# Patient Record
Sex: Female | Born: 1960 | Race: Black or African American | Hispanic: No | Smoking: Never smoker
Health system: Southern US, Community
[De-identification: ages and names within clinical notes are randomized; demographics above are authoritative.]

## PROBLEM LIST (undated history)

## (undated) DIAGNOSIS — E785 Hyperlipidemia, unspecified: Secondary | ICD-10-CM

## (undated) DIAGNOSIS — M8580 Other specified disorders of bone density and structure, unspecified site: Secondary | ICD-10-CM

## (undated) HISTORY — DX: Hyperlipidemia, unspecified: E78.5

## (undated) HISTORY — PX: COLONOSCOPY: SHX174

## (undated) HISTORY — DX: Other specified disorders of bone density and structure, unspecified site: M85.80

## (undated) HISTORY — PX: DENTAL EXAMINATION UNDER ANESTHESIA W/ CLEANING AND XRAYS: SHX1448

---

## 2000-03-25 ENCOUNTER — Ambulatory Visit (HOSPITAL_COMMUNITY): Admission: RE | Admit: 2000-03-25 | Discharge: 2000-03-25 | Payer: Self-pay | Admitting: Obstetrics and Gynecology

## 2000-05-02 ENCOUNTER — Other Ambulatory Visit: Admission: RE | Admit: 2000-05-02 | Discharge: 2000-05-02 | Payer: Self-pay | Admitting: Obstetrics and Gynecology

## 2000-05-26 ENCOUNTER — Inpatient Hospital Stay (HOSPITAL_COMMUNITY): Admission: RE | Admit: 2000-05-26 | Discharge: 2000-05-27 | Payer: Self-pay | Admitting: Obstetrics and Gynecology

## 2016-04-13 ENCOUNTER — Other Ambulatory Visit (HOSPITAL_BASED_OUTPATIENT_CLINIC_OR_DEPARTMENT_OTHER): Payer: Self-pay | Admitting: Internal Medicine

## 2016-04-13 ENCOUNTER — Other Ambulatory Visit: Payer: Self-pay | Admitting: Internal Medicine

## 2016-04-13 DIAGNOSIS — Z1231 Encounter for screening mammogram for malignant neoplasm of breast: Secondary | ICD-10-CM

## 2016-04-15 ENCOUNTER — Encounter (HOSPITAL_BASED_OUTPATIENT_CLINIC_OR_DEPARTMENT_OTHER): Payer: Self-pay | Admitting: Radiology

## 2016-04-15 ENCOUNTER — Ambulatory Visit (HOSPITAL_BASED_OUTPATIENT_CLINIC_OR_DEPARTMENT_OTHER)
Admission: RE | Admit: 2016-04-15 | Discharge: 2016-04-15 | Disposition: A | Discharge status: Home / Self Care | Payer: Managed Care, Other (non HMO) | Source: Ambulatory Visit | Attending: Internal Medicine | Admitting: Internal Medicine

## 2016-04-15 DIAGNOSIS — Z1231 Encounter for screening mammogram for malignant neoplasm of breast: Secondary | ICD-10-CM

## 2016-05-13 ENCOUNTER — Ambulatory Visit: Payer: Self-pay

## 2017-03-23 ENCOUNTER — Other Ambulatory Visit (HOSPITAL_BASED_OUTPATIENT_CLINIC_OR_DEPARTMENT_OTHER): Payer: Self-pay | Admitting: Internal Medicine

## 2017-03-23 DIAGNOSIS — Z1231 Encounter for screening mammogram for malignant neoplasm of breast: Secondary | ICD-10-CM

## 2017-04-26 ENCOUNTER — Ambulatory Visit (HOSPITAL_BASED_OUTPATIENT_CLINIC_OR_DEPARTMENT_OTHER): Payer: Managed Care, Other (non HMO)

## 2017-04-28 ENCOUNTER — Ambulatory Visit (HOSPITAL_BASED_OUTPATIENT_CLINIC_OR_DEPARTMENT_OTHER)
Admission: RE | Admit: 2017-04-28 | Discharge: 2017-04-28 | Disposition: A | Discharge status: Home / Self Care | Payer: BLUE CROSS/BLUE SHIELD | Source: Ambulatory Visit | Attending: Internal Medicine | Admitting: Internal Medicine

## 2017-04-28 DIAGNOSIS — Z1231 Encounter for screening mammogram for malignant neoplasm of breast: Secondary | ICD-10-CM | POA: Insufficient documentation

## 2017-05-19 ENCOUNTER — Other Ambulatory Visit (HOSPITAL_COMMUNITY): Payer: Self-pay | Admitting: Internal Medicine

## 2017-05-19 ENCOUNTER — Ambulatory Visit (HOSPITAL_BASED_OUTPATIENT_CLINIC_OR_DEPARTMENT_OTHER): Payer: BLUE CROSS/BLUE SHIELD

## 2017-05-19 ENCOUNTER — Other Ambulatory Visit: Payer: Self-pay | Admitting: Internal Medicine

## 2017-05-19 DIAGNOSIS — R319 Hematuria, unspecified: Secondary | ICD-10-CM

## 2017-05-19 DIAGNOSIS — M545 Low back pain: Secondary | ICD-10-CM

## 2018-05-15 ENCOUNTER — Other Ambulatory Visit (HOSPITAL_BASED_OUTPATIENT_CLINIC_OR_DEPARTMENT_OTHER): Payer: Self-pay | Admitting: Internal Medicine

## 2018-05-15 DIAGNOSIS — Z1231 Encounter for screening mammogram for malignant neoplasm of breast: Secondary | ICD-10-CM

## 2018-05-17 ENCOUNTER — Encounter (INDEPENDENT_AMBULATORY_CARE_PROVIDER_SITE_OTHER): Payer: Self-pay

## 2018-05-17 ENCOUNTER — Ambulatory Visit (HOSPITAL_BASED_OUTPATIENT_CLINIC_OR_DEPARTMENT_OTHER)
Admission: RE | Admit: 2018-05-17 | Discharge: 2018-05-17 | Disposition: A | Discharge status: Home / Self Care | Payer: Managed Care, Other (non HMO) | Source: Ambulatory Visit | Attending: Internal Medicine | Admitting: Internal Medicine

## 2018-05-17 DIAGNOSIS — Z1231 Encounter for screening mammogram for malignant neoplasm of breast: Secondary | ICD-10-CM | POA: Insufficient documentation

## 2018-05-19 ENCOUNTER — Other Ambulatory Visit: Payer: Self-pay | Admitting: Internal Medicine

## 2018-05-19 DIAGNOSIS — R928 Other abnormal and inconclusive findings on diagnostic imaging of breast: Secondary | ICD-10-CM

## 2018-05-25 ENCOUNTER — Ambulatory Visit: Payer: Managed Care, Other (non HMO)

## 2018-05-25 ENCOUNTER — Ambulatory Visit
Admission: RE | Admit: 2018-05-25 | Discharge: 2018-05-25 | Disposition: A | Discharge status: Home / Self Care | Payer: Managed Care, Other (non HMO) | Source: Ambulatory Visit | Attending: Internal Medicine | Admitting: Internal Medicine

## 2018-05-25 DIAGNOSIS — R928 Other abnormal and inconclusive findings on diagnostic imaging of breast: Secondary | ICD-10-CM

## 2018-05-26 ENCOUNTER — Other Ambulatory Visit: Payer: Managed Care, Other (non HMO)

## 2019-04-25 ENCOUNTER — Other Ambulatory Visit: Payer: Self-pay | Admitting: Internal Medicine

## 2019-04-25 DIAGNOSIS — Z1231 Encounter for screening mammogram for malignant neoplasm of breast: Secondary | ICD-10-CM

## 2019-06-19 ENCOUNTER — Ambulatory Visit: Payer: Managed Care, Other (non HMO)

## 2019-07-24 ENCOUNTER — Other Ambulatory Visit: Payer: Self-pay

## 2019-07-24 ENCOUNTER — Ambulatory Visit
Admission: RE | Admit: 2019-07-24 | Discharge: 2019-07-24 | Disposition: A | Discharge status: Home / Self Care | Payer: Managed Care, Other (non HMO) | Source: Ambulatory Visit | Attending: Internal Medicine | Admitting: Internal Medicine

## 2019-07-24 DIAGNOSIS — Z1231 Encounter for screening mammogram for malignant neoplasm of breast: Secondary | ICD-10-CM

## 2019-07-29 ENCOUNTER — Other Ambulatory Visit: Payer: Self-pay

## 2019-07-29 ENCOUNTER — Emergency Department (HOSPITAL_BASED_OUTPATIENT_CLINIC_OR_DEPARTMENT_OTHER)
Admission: EM | Admit: 2019-07-29 | Discharge: 2019-07-29 | Disposition: A | Discharge status: Home / Self Care | Payer: Managed Care, Other (non HMO) | Attending: Emergency Medicine | Admitting: Emergency Medicine

## 2019-07-29 ENCOUNTER — Encounter (HOSPITAL_BASED_OUTPATIENT_CLINIC_OR_DEPARTMENT_OTHER): Payer: Self-pay

## 2019-07-29 DIAGNOSIS — R1030 Lower abdominal pain, unspecified: Secondary | ICD-10-CM

## 2019-07-29 LAB — CBC
HCT: 45.8 % (ref 36.0–46.0)
Hemoglobin: 15 g/dL (ref 12.0–15.0)
MCH: 28.7 pg (ref 26.0–34.0)
MCHC: 32.8 g/dL (ref 30.0–36.0)
MCV: 87.6 fL (ref 80.0–100.0)
Platelets: 334 10*3/uL (ref 150–400)
RBC: 5.23 MIL/uL — ABNORMAL HIGH (ref 3.87–5.11)
RDW: 12.5 % (ref 11.5–15.5)
WBC: 7.7 10*3/uL (ref 4.0–10.5)
nRBC: 0 % (ref 0.0–0.2)

## 2019-07-29 LAB — COMPREHENSIVE METABOLIC PANEL
ALT: 22 U/L (ref 0–44)
AST: 26 U/L (ref 15–41)
Albumin: 4.4 g/dL (ref 3.5–5.0)
Alkaline Phosphatase: 54 U/L (ref 38–126)
Anion gap: 8 (ref 5–15)
BUN: 19 mg/dL (ref 6–20)
CO2: 27 mmol/L (ref 22–32)
Calcium: 9.6 mg/dL (ref 8.9–10.3)
Chloride: 102 mmol/L (ref 98–111)
Creatinine, Ser: 0.75 mg/dL (ref 0.44–1.00)
GFR calc Af Amer: >60 mL/min (ref >60)
GFR calc non Af Amer: >60 mL/min (ref >60)
Glucose, Bld: 95 mg/dL (ref 70–99)
Potassium: 3.9 mmol/L (ref 3.5–5.1)
Sodium: 137 mmol/L (ref 135–145)
Total Bilirubin: 0.7 mg/dL (ref 0.3–1.2)
Total Protein: 7.7 g/dL (ref 6.5–8.1)

## 2019-07-29 LAB — URINALYSIS, ROUTINE W REFLEX MICROSCOPIC
Bilirubin Urine: NEGATIVE
Glucose, UA: NEGATIVE mg/dL
Ketones, ur: NEGATIVE mg/dL
Leukocytes,Ua: NEGATIVE
Nitrite: NEGATIVE
Protein, ur: NEGATIVE mg/dL
Specific Gravity, Urine: >1.03 — ABNORMAL HIGH (ref 1.005–1.030)
pH: 5.5 (ref 5.0–8.0)

## 2019-07-29 LAB — URINALYSIS, MICROSCOPIC (REFLEX)

## 2019-07-29 LAB — LIPASE, BLOOD: Lipase: 25 U/L (ref 11–51)

## 2019-07-29 NOTE — Discharge Instructions (Addendum)
Your labwork was very reassuring today. You did have a small amount of blood in your urine. It is difficult to say whether your pain is related to a kidney stone vs another reason.   Given you do not have any abdominal pain at this time you have declined a CT scan.   If the pain returns you are more than welcome to come back to the ED for further evaluation. If your symptoms are related to a kidney stone 600 mg Ibuprofen every 6-8 hours can help with the pain as well as increasing your water intake.   Return to the ED IMMEDIATELY for any worsening symptoms including worsening pain, vomiting, fevers > 100.4, blood in your stool, difficulty urinating, or any other concerning symptoms.

## 2019-07-29 NOTE — ED Provider Notes (Signed)
[facility] EMERGENCY DEPARTMENT Provider Note   CSN: [id number] Arrival date & time: 07/29/19  1120     History Chief Complaint  Patient presents with  . Abdominal Pain    Tara Smith is a 59 y.o. female who presents to the ED today complaining of sudden onset, intermittent, currently resolved, LLQ abdominal pain that began this morning. Pt also complains of nausea and looser stools. She has not taken anything for the pain however states it has since gone away and she is no longer having any symptoms. Pt denies any recent foreign travel or suspicious food intake. No hx of diverticulitis. Denies fevers, chills, vomiting, blood in stool, dysuria, urinary frequency, vaginal discharge, pelvic pain, or any other associated symptoms.   The history is provided by the patient.       History reviewed. No pertinent past medical history.  There are no problems to display for this patient.   History reviewed. No pertinent surgical history.   OB History   No obstetric history on file.     No family history on file.  Social History   Tobacco Use  . Smoking status: Never Smoker  . Smokeless tobacco: Never Used  Substance Use Topics  . Alcohol use: Not Currently  . Drug use: Not Currently    Home Medications Prior to Admission medications   Not on File    Allergies    Prochlorperazine  Review of Systems   Review of Systems  Constitutional: Negative for chills and fever.  Respiratory: Negative for cough and shortness of breath.   Cardiovascular: Negative for chest pain.  Gastrointestinal: Positive for abdominal pain and nausea. Negative for blood in stool, constipation, diarrhea and vomiting.  Genitourinary: Negative for difficulty urinating, flank pain, frequency, pelvic pain, vaginal bleeding and vaginal discharge.  All other systems reviewed and are negative.   Physical Exam Updated Vital Signs BP (!) 155/95 (BP Location: Right Arm)   Pulse 61    Temp 98.6 F (37 C) (Oral)   Resp 18   Ht 4\' 11"  (1.499 m)   Wt 54.4 kg   SpO2 100%   BMI 24.24 kg/m   Physical Exam Vitals and nursing note reviewed.  Constitutional:      Appearance: She is not ill-appearing or diaphoretic.  HENT:     Head: Normocephalic and atraumatic.  Eyes:     Conjunctiva/sclera: Conjunctivae normal.  Cardiovascular:     Rate and Rhythm: Normal rate and regular rhythm.     Heart sounds: Normal heart sounds.  Pulmonary:     Effort: Pulmonary effort is normal.     Breath sounds: Normal breath sounds. No wheezing, rhonchi or rales.  Abdominal:     Palpations: Abdomen is soft.     Tenderness: There is no abdominal tenderness. There is no right CVA tenderness, left CVA tenderness, guarding or rebound.  Musculoskeletal:     Cervical back: Neck supple.  Skin:    General: Skin is warm and dry.  Neurological:     Mental Status: She is alert.     ED Results / Procedures / Treatments   Labs (all labs ordered are listed, but only abnormal results are displayed) Labs Reviewed  CBC - Abnormal; Notable for the following components:      Result Value   RBC 5.23 (*)    All other components within normal limits  URINALYSIS, ROUTINE W REFLEX MICROSCOPIC - Abnormal; Notable for the following components:   Specific Gravity, Urine >1.030 (*)  Hgb urine dipstick SMALL (*)    All other components within normal limits  URINALYSIS, MICROSCOPIC (REFLEX) - Abnormal; Notable for the following components:   Bacteria, UA FEW (*)    All other components within normal limits  LIPASE, BLOOD  COMPREHENSIVE METABOLIC PANEL    EKG None  Radiology No results found.  Procedures Procedures (including critical care time)  Medications Ordered in ED Medications - No data to display  ED Course  I have reviewed the triage vital signs and the nursing notes.  Pertinent labs & imaging results that were available during my care of the patient were reviewed by me and  considered in my medical decision making (see chart for details).  59 year old female who presents the ED today complaining of left lower quadrant abdominal pain that has since resolved.  States the last 2 hours before going away.  Also having nausea and some looser stools.  On arrival to the ED patient is afebrile, nontachycardic and nontachypneic.  Lab work was drawn prior to being seen.  Upon my initial evaluation patient states her pain is completely resolved.  She has no focal abdominal tenderness on exam today.  Suspicion for acute abdomen.  There is no CVA tenderness to suggest kidney stones.  Patient is denying any urinary symptoms.   CBC without leukocytosis. Hgb stable.  CMP without electrolyte abnormalities. Creatinine 0.75. No elevations in LFTs.  Lipase 25.  U/A with increased specific gravity and small hgb. No signs of infection.   Have offered pt a CT scan to evaluate for kidney stones as well as to rule out diverticulitis however patient states she feels improved and does not want a CT scan if it is not necessary. Again labwork is very reassuring today. There are no signs indicative of an infection. She does have some blood  In her urine which could indicate a kidney stone. There is no RLQ tenderness at this time to suggest appendicitis. Have encouraged increased oral intake as she appears mildly dehydrated as well as ibuprofen PRN for pain. Pt in agreement to return to the ED if her pain resumes or for any worsening symptoms. Strict return precautions discussed. Pt is in agreement with plan and stable for discharge home.   This note was prepared using Dragon voice recognition software and may include unintentional dictation errors due to the inherent limitations of voice recognition software.     MDM Rules/Calculators/A&P                       Final Clinical Impression(s) / ED Diagnoses Final diagnoses:  Lower abdominal pain    Rx / DC Orders ED Discharge Orders    None        Discharge Instructions     Your labwork was very reassuring today. You did have a small amount of blood in your urine. It is difficult to say whether your pain is related to a kidney stone vs another reason.   Given you do not have any abdominal pain at this time you have declined a CT scan.   If the pain returns you are more than welcome to come back to the ED for further evaluation. If your symptoms are related to a kidney stone 600 mg Ibuprofen every 6-8 hours can help with the pain as well as increasing your water intake.   Return to the ED IMMEDIATELY for any worsening symptoms including worsening pain, vomiting, fevers > 100.4, blood in your stool, difficulty urinating,  or any other concerning symptoms.        Tanda Rockers, PA-C 07/29/19 1512    Arby Barrette, MD 07/31/19 [phone]

## 2019-07-29 NOTE — ED Triage Notes (Signed)
Pt c/o lower abdominal pain starting this morning. Some nausea and "loose stool but not diarrhea".

## 2020-09-02 ENCOUNTER — Other Ambulatory Visit: Payer: Self-pay | Admitting: Internal Medicine

## 2020-09-02 DIAGNOSIS — Z1231 Encounter for screening mammogram for malignant neoplasm of breast: Secondary | ICD-10-CM

## 2020-11-10 ENCOUNTER — Other Ambulatory Visit: Payer: Self-pay

## 2020-11-10 ENCOUNTER — Ambulatory Visit
Admission: RE | Admit: 2020-11-10 | Discharge: 2020-11-10 | Disposition: A | Discharge status: Home / Self Care | Payer: Managed Care, Other (non HMO) | Source: Ambulatory Visit | Attending: Internal Medicine | Admitting: Internal Medicine

## 2020-11-10 DIAGNOSIS — Z1231 Encounter for screening mammogram for malignant neoplasm of breast: Secondary | ICD-10-CM

## 2021-07-14 IMAGING — MG MM DIGITAL SCREENING BILAT W/ TOMO AND CAD
6 of 10 series · 6 of 30 positions shown · non-contrast
Comparison: Previous exam(s).

CLINICAL DATA: Screening.

EXAM:
DIGITAL SCREENING BILATERAL MAMMOGRAM WITH TOMOSYNTHESIS AND CAD
TECHNIQUE: Bilateral screening digital craniocaudal and mediolateral oblique
mammograms were obtained. Bilateral screening digital breast
tomosynthesis was performed. The images were evaluated with
computer-aided detection.

[R MLO synth-2D (1 of 2)]
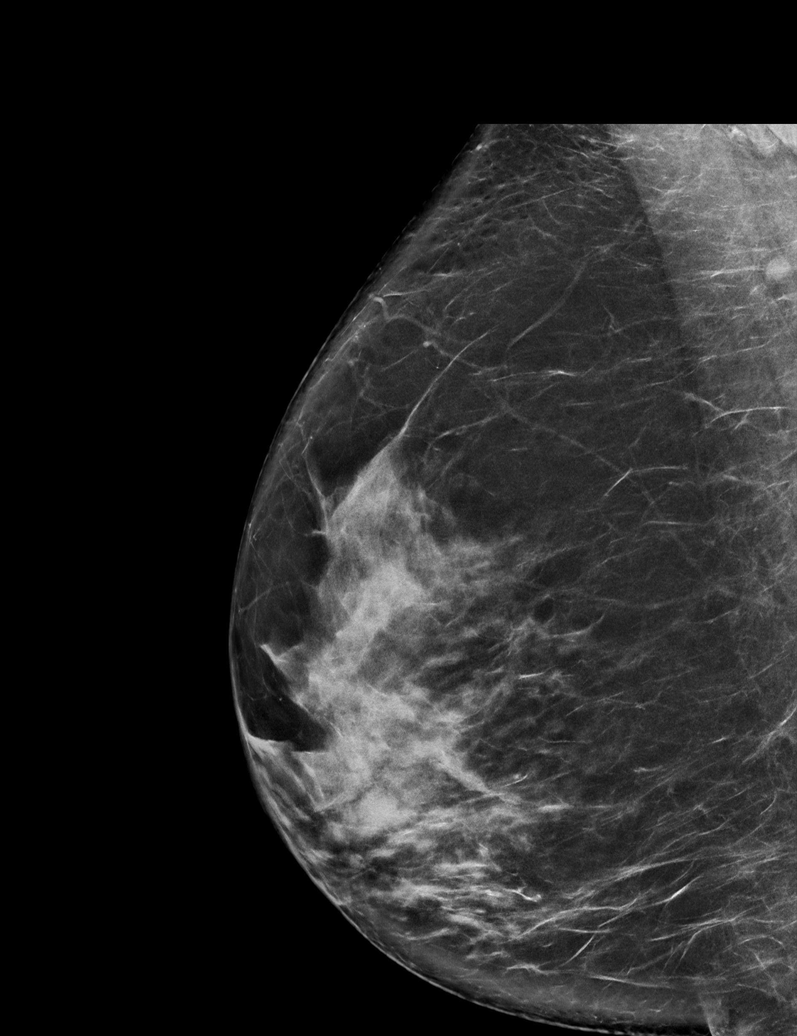

[R CC synth-2D]
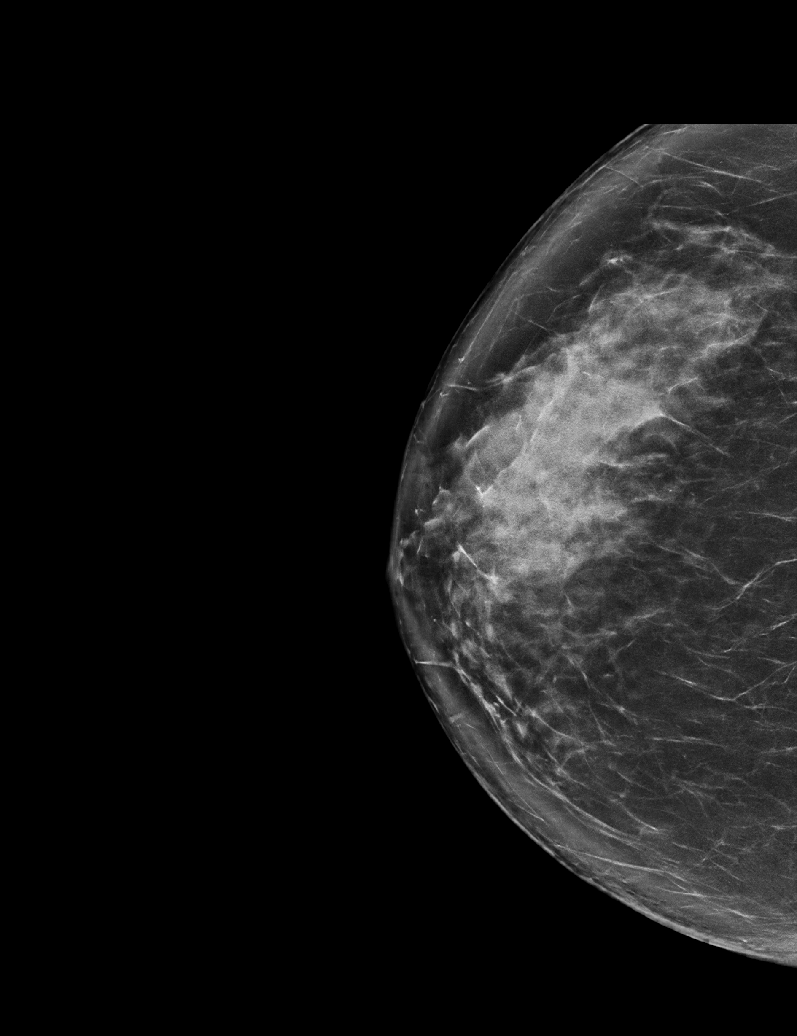

[L MLO synth-2D]
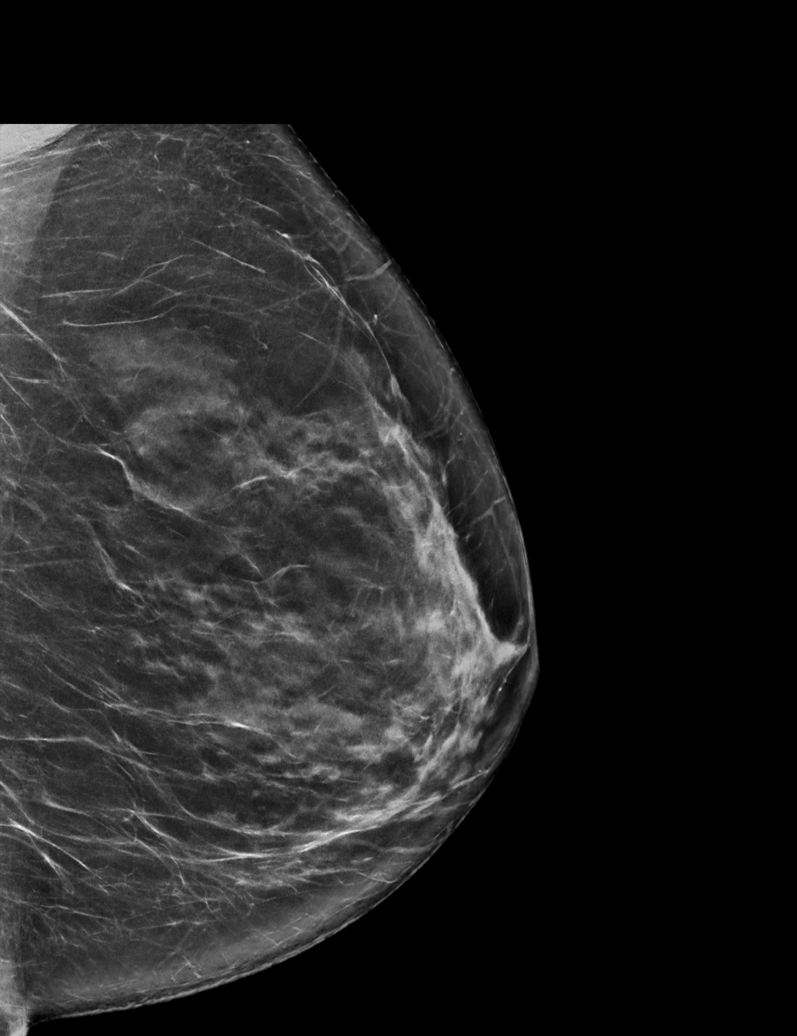

[R MLO synth-2D (2 of 2)]
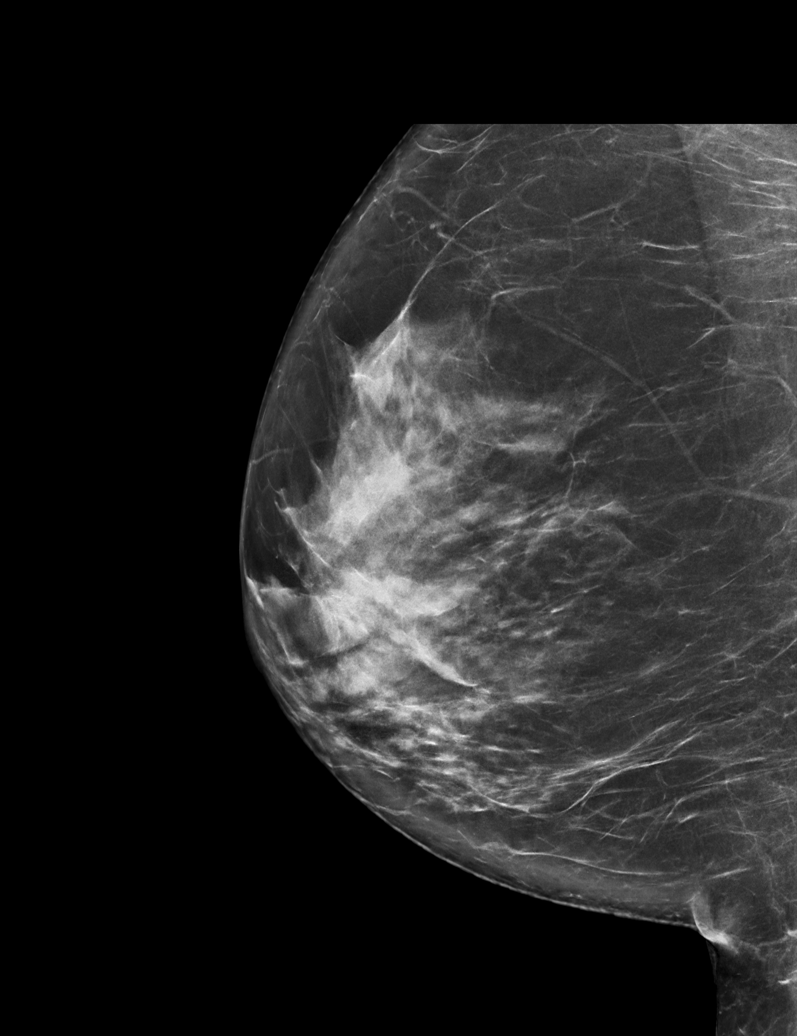

[L CC synth-2D]
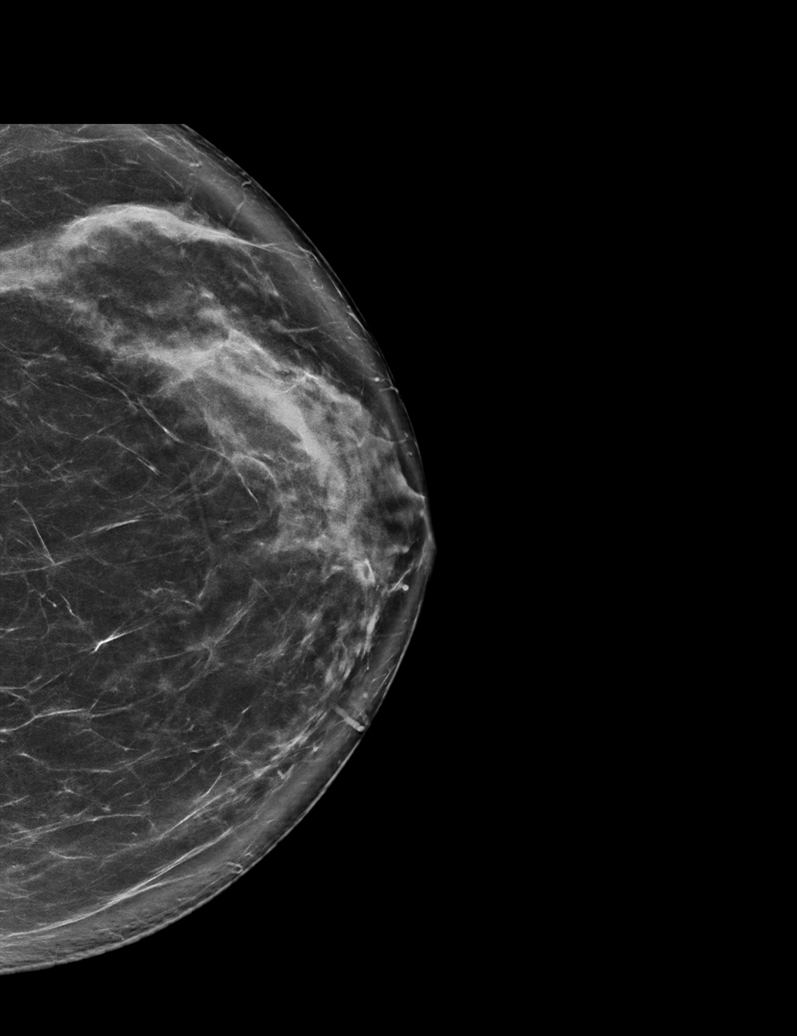

[R CC tomo · tomo slice 43/86.0]
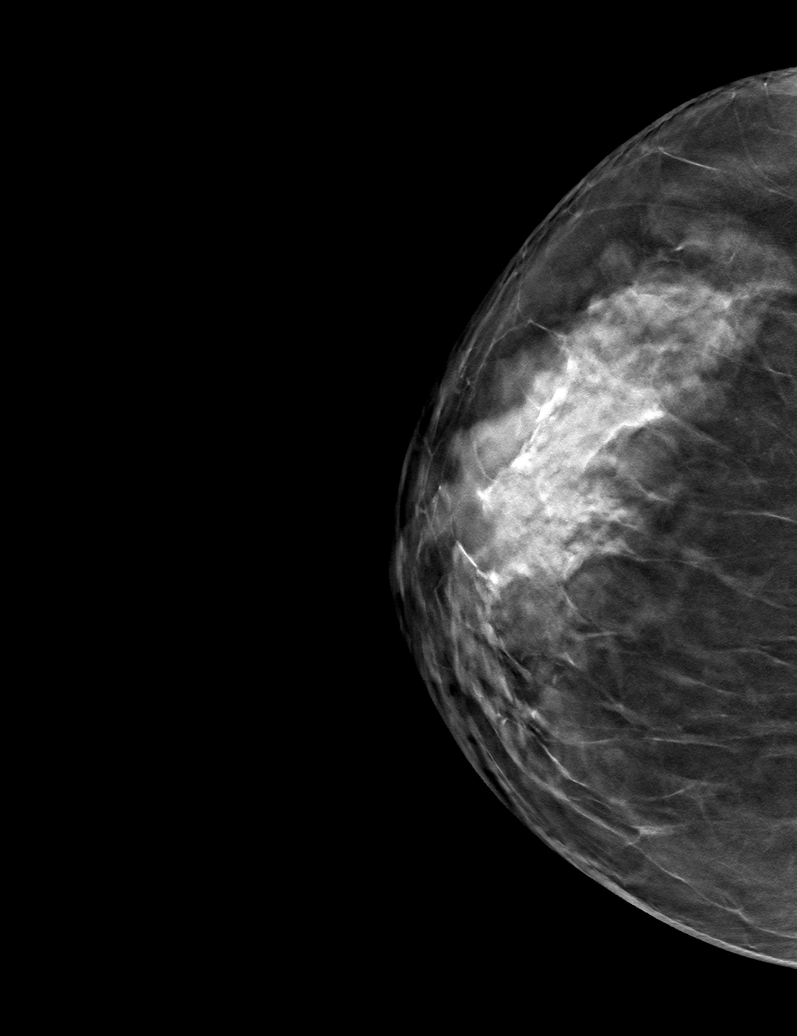

[6 of 30 positions shown; findings below may reference images not displayed]

ACR Breast Density Category c: The breast tissue is heterogeneously
dense, which may obscure small masses.
FINDINGS: There are no findings suspicious for malignancy. The images were
evaluated with computer-aided detection.
IMPRESSION: No mammographic evidence of malignancy. A result letter of this
screening mammogram will be mailed directly to the patient.

RECOMMENDATION:
Screening mammogram in one year. (Code:T4-5-GWO)

BI-RADS CATEGORY  1: Negative.

## 2021-10-07 ENCOUNTER — Other Ambulatory Visit: Payer: Self-pay | Admitting: Internal Medicine

## 2021-10-07 DIAGNOSIS — Z1231 Encounter for screening mammogram for malignant neoplasm of breast: Secondary | ICD-10-CM

## 2021-11-11 ENCOUNTER — Ambulatory Visit: Payer: Managed Care, Other (non HMO)

## 2021-11-17 ENCOUNTER — Ambulatory Visit
Admission: RE | Admit: 2021-11-17 | Discharge: 2021-11-17 | Disposition: A | Discharge status: Home / Self Care | Payer: Managed Care, Other (non HMO) | Source: Ambulatory Visit | Attending: Internal Medicine | Admitting: Internal Medicine

## 2021-11-17 DIAGNOSIS — Z1231 Encounter for screening mammogram for malignant neoplasm of breast: Secondary | ICD-10-CM

## 2022-09-13 ENCOUNTER — Ambulatory Visit (AMBULATORY_SURGERY_CENTER): Payer: Self-pay | Admitting: *Deleted

## 2022-09-13 VITALS — Ht 59.0 in | Wt 126.0 lb

## 2022-09-13 DIAGNOSIS — Z1211 Encounter for screening for malignant neoplasm of colon: Secondary | ICD-10-CM

## 2022-09-13 MED ORDER — NA SULFATE-K SULFATE-MG SULF 17.5-3.13-1.6 GM/177ML PO SOLN: 1.0000 | Freq: Once | ORAL | 0 refills | Status: AC

## 2022-09-13 NOTE — Progress Notes (Signed)
No egg or soy allergy known to patient  No issues known to pt with past sedation with any surgeries or procedures Patient denies ever being told they had issues or difficulty with intubation  No FH of Malignant Hyperthermia Pt is not on diet pills Pt is not on  home 02  Pt is not on blood thinners  Pt denies issues with constipation  No A fib or A flutter Have any cardiac testing pending--no Pt instructed to use Singlecare.com or GoodRx for a price reduction on prep  Patient's chart reviewed by Tara Smith CNRA prior to previsit and patient appropriate for the [facility].  Previsit completed and red dot placed by patient's name on their procedure day (on provider's schedule).    

## 2022-09-28 ENCOUNTER — Encounter: Payer: Self-pay | Admitting: Gastroenterology

## 2022-10-04 ENCOUNTER — Other Ambulatory Visit: Payer: Self-pay | Admitting: Internal Medicine

## 2022-10-04 DIAGNOSIS — Z0000 Encounter for general adult medical examination without abnormal findings: Secondary | ICD-10-CM

## 2022-10-08 ENCOUNTER — Ambulatory Visit (AMBULATORY_SURGERY_CENTER): Payer: 59 | Admitting: Gastroenterology

## 2022-10-08 ENCOUNTER — Encounter: Payer: Self-pay | Admitting: Gastroenterology

## 2022-10-08 VITALS — BP 126/80 | HR 64 | Temp 98.6°F | Resp 15 | Ht 59.0 in | Wt 126.0 lb

## 2022-10-08 DIAGNOSIS — Z1211 Encounter for screening for malignant neoplasm of colon: Secondary | ICD-10-CM

## 2022-10-08 DIAGNOSIS — K635 Polyp of colon: Secondary | ICD-10-CM | POA: Diagnosis not present

## 2022-10-08 DIAGNOSIS — D125 Benign neoplasm of sigmoid colon: Secondary | ICD-10-CM

## 2022-10-08 MED ORDER — SODIUM CHLORIDE 0.9 % IV SOLN: 500.0000 mL | Freq: Once | INTRAVENOUS | Status: DC

## 2022-10-08 NOTE — Progress Notes (Signed)
Uneventful anesthetic. Report to pacu rn. Vss. Care resumed by rn. 

## 2022-10-08 NOTE — Progress Notes (Signed)
History and Physical:  This patient presents for endoscopic testing for: Encounter Diagnosis  Name Primary?   Special screening for malignant neoplasms, colon Yes    Average risk for colorectal cancer.  Reports normal colonoscopy in [place] > 10 yrs ago Patient denies chronic abdominal pain, rectal bleeding, constipation or diarrhea.    Patient is otherwise without complaints or active issues today.   Past Medical History: Past Medical History:  Diagnosis Date   Hyperlipidemia    Osteopenia      Past Surgical History: Past Surgical History:  Procedure Laterality Date   COLONOSCOPY     DENTAL EXAMINATION UNDER ANESTHESIA W/ CLEANING AND XRAYS      Allergies: Allergies  Allergen Reactions   Other     Other Reaction(s): not sure which one but it made her feel very sick   Prochlorperazine     Other reaction(s): Myalgias (intolerance)    Outpatient Meds: Current Outpatient Medications  Medication Sig Dispense Refill   Cholecalciferol (VITAMIN D3) 50 MCG (2000 UT) TABS Take by mouth daily.     MAGNESIUM PO Take by mouth daily. Take one gummy daily     OVER THE COUNTER MEDICATION daily. Fiber one tablet daily     rosuvastatin (CRESTOR) 10 MG tablet Take by mouth.     moxifloxacin (VIGAMOX) 0.5 % ophthalmic solution Apply to eye.     triamcinolone cream (KENALOG) 0.1 % Apply to affected areas three times a day as needed External     Current Facility-Administered Medications  Medication Dose Route Frequency Provider Last Rate Last Admin   0.9 %  sodium chloride infusion  500 mL Intravenous Once Danis, Andreas Blower, MD          ___________________________________________________________________ Objective   Exam:  BP (!) 139/91   Pulse 75   Temp 98.6 F (37 C) (Temporal)   Resp 15   Ht 4\' 11"  (1.499 m)   Wt 126 lb (57.2 kg)   SpO2 100%   BMI 25.45 kg/m   CV: regular , S1/S2 Resp: clear to auscultation bilaterally, normal RR and effort noted GI: soft, no  tenderness, with active bowel sounds.   Assessment: Encounter Diagnosis  Name Primary?   Special screening for malignant neoplasms, colon Yes     Plan: Colonoscopy   The benefits and risks of the planned procedure were described in detail with the patient or (when appropriate) their health care proxy.  Risks were outlined as including, but not limited to, bleeding, infection, perforation, adverse medication reaction leading to cardiac or pulmonary decompensation, pancreatitis (if ERCP).  The limitation of incomplete mucosal visualization was also discussed.  No guarantees or warranties were given.  The patient is appropriate for an endoscopic procedure in the ambulatory setting.   - Amada Jupiter, MD

## 2022-10-08 NOTE — Progress Notes (Signed)
VS completed by CW   Pt's states no medical or surgical changes since previsit or office visit.  

## 2022-10-08 NOTE — Progress Notes (Signed)
Called to room to assist during endoscopic procedure.  Patient ID and intended procedure confirmed with present staff. Received instructions for my participation in the procedure from the performing physician.  

## 2022-10-08 NOTE — Patient Instructions (Signed)
Handout on polyps and diverticulosis given.    YOU HAD AN ENDOSCOPIC PROCEDURE TODAY AT THE [facility]:   Refer to the procedure report that was given to you for any specific questions about what was found during the examination.  If the procedure report does not answer your questions, please call your gastroenterologist to clarify.  If you requested that your care partner not be given the details of your procedure findings, then the procedure report has been included in a sealed envelope for you to review at your convenience later.  YOU SHOULD EXPECT: Some feelings of bloating in the abdomen. Passage of more gas than usual.  Walking can help get rid of the air that was put into your GI tract during the procedure and reduce the bloating. If you had a lower endoscopy (such as a colonoscopy or flexible sigmoidoscopy) you may notice spotting of blood in your stool or on the toilet paper. If you underwent a bowel prep for your procedure, you may not have a normal bowel movement for a few days.  Please Note:  You might notice some irritation and congestion in your nose or some drainage.  This is from the oxygen used during your procedure.  There is no need for concern and it should clear up in a day or so.  SYMPTOMS TO REPORT IMMEDIATELY:  Following lower endoscopy (colonoscopy or flexible sigmoidoscopy):  Excessive amounts of blood in the stool  Significant tenderness or worsening of abdominal pains  Swelling of the abdomen that is new, acute  Fever of 100F or higher   For urgent or emergent issues, a gastroenterologist can be reached at any hour by calling [phone]. Do not use MyChart messaging for urgent concerns.    DIET:  We do recommend a small meal at first, but then you may proceed to your regular diet.  Drink plenty of fluids but you should avoid alcoholic beverages for 24 hours.  ACTIVITY:  You should plan to take it easy for the rest of today and you should NOT  DRIVE or use heavy machinery until tomorrow (because of the sedation medicines used during the test).    FOLLOW UP: Our staff will call the number listed on your records the next business day following your procedure.  We will call around 7:15- 8:00 am to check on you and address any questions or concerns that you may have regarding the information given to you following your procedure. If we do not reach you, we will leave a message.     If any biopsies were taken you will be contacted by phone or by letter within the next 1-3 weeks.  Please call us at [phone] if you have not heard about the biopsies in 3 weeks.    SIGNATURES/CONFIDENTIALITY: You and/or your care partner have signed paperwork which will be entered into your electronic medical record.  These signatures attest to the fact that that the information above on your After Visit Summary has been reviewed and is understood.  Full responsibility of the confidentiality of this discharge information lies with you and/or your care-partner. 

## 2022-10-08 NOTE — Op Note (Signed)
Tara Smith Patient Name: Tara Smith Procedure Date: 10/08/2022 7:04 AM MRN: [id number] Endoscopist: Sherilyn Cooter L. Myrtie Tara Smith , MD, [id number] Age: 62 Referring MD:  Date of Birth: May 11, 1961 Gender: Female Account #: [id number] Procedure:                Colonoscopy Indications:              Screening for colorectal malignant neoplasm                           Patient reports no polyps on last colonoscopy in                            [place] over 10 years ago Medicines:                Monitored Anesthesia Care Procedure:                Pre-Anesthesia Assessment:                           - Prior to the procedure, a History and Physical                            was performed, and patient medications and                            allergies were reviewed. The patient's tolerance of                            previous anesthesia was also reviewed. The risks                            and benefits of the procedure and the sedation                            options and risks were discussed with the patient.                            All questions were answered, and informed consent                            was obtained. Prior Anticoagulants: The patient has                            taken no anticoagulant or antiplatelet agents. ASA                            Grade Assessment: I - A normal, healthy patient.                            After reviewing the risks and benefits, the patient                            was deemed in satisfactory condition to undergo the  procedure.                           After obtaining informed consent, the colonoscope                            was passed under direct vision. Throughout the                            procedure, the patient's blood pressure, pulse, and                            oxygen saturations were monitored continuously. The                            Olympus CF-HQ190L ([id number]) Colonoscope was                             introduced through the anus and advanced to the the                            cecum, identified by appendiceal orifice and                            ileocecal valve. The colonoscopy was performed                            without difficulty. The patient tolerated the                            procedure well. The quality of the bowel                            preparation was excellent. The ileocecal valve,                            appendiceal orifice, and rectum were photographed. Scope In: 8:09:19 AM Scope Out: 8:20:13 AM Scope Withdrawal Time: 0 hours 8 minutes 33 seconds  Total Procedure Duration: 0 hours 10 minutes 54 seconds  Findings:                 The perianal and digital rectal examinations were                            normal.                           Repeat examination of right colon under NBI                            performed.                           Multiple small-mouthed diverticula were found in  the left colon and right colon.                           A diminutive polyp was found in the sigmoid colon.                            The polyp was sessile. The polyp was removed with a                            cold snare. Resection and retrieval were complete.                           Retroflexion in the rectum was not performed due to                            anatomy.                           The exam was otherwise without abnormality. Complications:            No immediate complications. Estimated Blood Loss:     Estimated blood loss was minimal. Impression:               - Diverticulosis in the left colon and in the right                            colon.                           - One diminutive polyp in the sigmoid colon,                            removed with a cold snare. Resected and retrieved.                           - The examination was otherwise normal. Recommendation:           - Patient has a  contact number available for                            emergencies. The signs and symptoms of potential                            delayed complications were discussed with the                            patient. Return to normal activities tomorrow.                            Written discharge instructions were provided to the                            patient.                           - Resume previous diet.                           -  Continue present medications.                           - Await pathology results.                           - Repeat colonoscopy is recommended for                            surveillance. The colonoscopy date will be                            determined after pathology results from today's                            exam become available for review. Ezra Denne L. [name], MD 10/08/2022 8:25:46 AM This report has been signed electronically.

## 2022-10-11 ENCOUNTER — Telehealth: Payer: Self-pay

## 2022-10-11 ENCOUNTER — Other Ambulatory Visit: Payer: Self-pay | Admitting: Internal Medicine

## 2022-10-11 DIAGNOSIS — E785 Hyperlipidemia, unspecified: Secondary | ICD-10-CM

## 2022-10-11 NOTE — Telephone Encounter (Signed)
  Follow up Call-     10/08/2022    7:18 AM  Call back number  Post procedure Call Back phone  # [phone]  Permission to leave phone message Yes     Patient questions:  Do you have a fever, pain , or abdominal swelling? No. Pain Score  0 *  Have you tolerated food without any problems? Yes.    Have you been able to return to your normal activities? Yes.    Do you have any questions about your discharge instructions: Diet   No. Medications  No. Follow up visit  No.  Do you have questions or concerns about your Care? No.  Actions: * If pain score is 4 or above: No action needed, pain <4.

## 2022-10-13 ENCOUNTER — Encounter: Payer: Self-pay | Admitting: Gastroenterology

## 2022-11-11 ENCOUNTER — Ambulatory Visit (HOSPITAL_BASED_OUTPATIENT_CLINIC_OR_DEPARTMENT_OTHER)
Admission: RE | Admit: 2022-11-11 | Discharge: 2022-11-11 | Disposition: A | Discharge status: Home / Self Care | Payer: 59 | Source: Ambulatory Visit | Attending: Internal Medicine | Admitting: Internal Medicine

## 2022-11-11 DIAGNOSIS — E785 Hyperlipidemia, unspecified: Secondary | ICD-10-CM | POA: Insufficient documentation

## 2022-11-25 ENCOUNTER — Ambulatory Visit
Admission: RE | Admit: 2022-11-25 | Discharge: 2022-11-25 | Disposition: A | Discharge status: Home / Self Care | Payer: 59 | Source: Ambulatory Visit | Attending: Internal Medicine | Admitting: Internal Medicine

## 2022-11-25 DIAGNOSIS — Z0000 Encounter for general adult medical examination without abnormal findings: Secondary | ICD-10-CM

## 2023-10-12 ENCOUNTER — Other Ambulatory Visit: Payer: Self-pay | Admitting: Internal Medicine

## 2023-10-12 DIAGNOSIS — Z1231 Encounter for screening mammogram for malignant neoplasm of breast: Secondary | ICD-10-CM

## 2023-10-13 ENCOUNTER — Ambulatory Visit
Admission: RE | Admit: 2023-10-13 | Discharge: 2023-10-13 | Disposition: A | Discharge status: Home / Self Care | Source: Ambulatory Visit | Attending: Internal Medicine | Admitting: Internal Medicine

## 2023-10-13 DIAGNOSIS — Z1231 Encounter for screening mammogram for malignant neoplasm of breast: Secondary | ICD-10-CM

## 2023-10-19 ENCOUNTER — Other Ambulatory Visit: Payer: Self-pay | Admitting: Internal Medicine

## 2023-10-19 DIAGNOSIS — R928 Other abnormal and inconclusive findings on diagnostic imaging of breast: Secondary | ICD-10-CM

## 2023-10-26 ENCOUNTER — Ambulatory Visit
Admission: RE | Admit: 2023-10-26 | Discharge: 2023-10-26 | Discharge status: Home / Self Care | Source: Ambulatory Visit | Attending: Internal Medicine | Admitting: Internal Medicine

## 2023-10-26 ENCOUNTER — Ambulatory Visit
Admission: RE | Admit: 2023-10-26 | Discharge: 2023-10-26 | Disposition: A | Discharge status: Home / Self Care | Source: Ambulatory Visit | Attending: Internal Medicine | Admitting: Internal Medicine

## 2023-10-26 DIAGNOSIS — R928 Other abnormal and inconclusive findings on diagnostic imaging of breast: Secondary | ICD-10-CM
